# Patient Record
Sex: Male | Born: 1965 | Race: White | Hispanic: No | Marital: Married | Smoking: Never smoker
Health system: Southern US, Community
[De-identification: ages and names within clinical notes are randomized; demographics above are authoritative.]

---

## 2017-04-24 ENCOUNTER — Ambulatory Visit (INDEPENDENT_AMBULATORY_CARE_PROVIDER_SITE_OTHER): Payer: BLUE CROSS/BLUE SHIELD

## 2017-04-24 ENCOUNTER — Ambulatory Visit
Admission: EM | Admit: 2017-04-24 | Discharge: 2017-04-24 | Disposition: A | Discharge status: Home / Self Care | Payer: BLUE CROSS/BLUE SHIELD | Attending: Family Medicine | Admitting: Family Medicine

## 2017-04-24 DIAGNOSIS — L03116 Cellulitis of left lower limb: Secondary | ICD-10-CM | POA: Diagnosis not present

## 2017-04-24 DIAGNOSIS — L02612 Cutaneous abscess of left foot: Secondary | ICD-10-CM | POA: Diagnosis not present

## 2017-04-24 MED ORDER — CEFAZOLIN SODIUM 1 G IJ SOLR
1.0000 g | Freq: Once | INTRAMUSCULAR | Status: AC
  Administered 2017-04-24: 1 g via INTRAMUSCULAR

## 2017-04-24 MED ORDER — SULFAMETHOXAZOLE-TRIMETHOPRIM 800-160 MG PO TABS
1.0000 | ORAL_TABLET | Freq: Two times a day (BID) | ORAL | 0 refills | Status: AC
Start: 2017-04-24 — End: ?

## 2017-04-24 NOTE — ED Triage Notes (Signed)
Pt said he woke up this morning and noticed his left foot was sore and now it's getting progressively worse. Now swollen and looks like a sore on the bottom of his left foot. Also noticed a strip going across the bottom and top of his foot. Has not tired anything otc, doesn't remember injuring it.

## 2017-04-24 NOTE — ED Provider Notes (Addendum)
[facility]    CSN: [id number] Arrival date & time: 04/24/17  1418     History   Chief Complaint Chief Complaint  Patient presents with  . Foot Pain    HPI Pedro Williams is a 51 y.o. male.   51 yo male with a c/o left foot pain, swelling and redness since yesterday but worse this morning. Denies any trauma, injuries, fevers, chills.    The history is provided by the patient.  Foot Pain     History reviewed. No pertinent past medical history.  There are no active problems to display for this patient.   History reviewed. No pertinent surgical history.     Home Medications    Prior to Admission medications   Medication Sig Start Date End Date Taking? Authorizing Provider  sulfamethoxazole-trimethoprim (BACTRIM DS,SEPTRA DS) 800-160 MG tablet Take 1 tablet by mouth 2 (two) times daily. 04/24/17   Payton Mccallum, MD    Family History No family history on file.  Social History Social History  Substance Use Topics  . Smoking status: Never Smoker  . Smokeless tobacco: Never Used  . Alcohol use No     Allergies   Patient has no known allergies.   Review of Systems Review of Systems   Physical Exam Triage Vital Signs ED Triage Vitals  Enc Vitals Group     BP 04/24/17 1434 135/90     Pulse Rate 04/24/17 1434 94     Resp 04/24/17 1434 (!) 22     Temp 04/24/17 1434 98.3 F (36.8 C)     Temp src --      SpO2 04/24/17 1434 98 %     Weight --      Height --      Head Circumference --      Peak Flow --      Pain Score 04/24/17 1433 5     Pain Loc --      Pain Edu? --      Excl. in GC? --    No data found.   Updated Vital Signs BP 135/90 (BP Location: Left Arm)   Pulse 94   Temp 98.3 F (36.8 C)   Resp (!) 22   SpO2 98%   Visual Acuity Right Eye Distance:   Left Eye Distance:   Bilateral Distance:    Right Eye Near:   Left Eye Near:    Bilateral Near:     Physical Exam  Constitutional: He appears well-developed and  well-nourished. No distress.  Musculoskeletal: He exhibits edema.       Left foot: There is tenderness (to palpation over plantar aspect of foot; mild edema, warmth and blanchable erythema noted; ) and swelling.       Feet:  One small 8mm superficial pinpoint pus pocket note on skin on plantar distal  aspect of foot near the 5th toe  Skin: He is not diaphoretic.  Nursing note and vitals reviewed.    UC Treatments / Results  Labs (all labs ordered are listed, but only abnormal results are displayed) Labs Reviewed  AEROBIC CULTURE (SUPERFICIAL SPECIMEN)    EKG  EKG Interpretation None       Radiology Dg Foot Complete Left  Result Date: 04/24/2017 CLINICAL DATA:  Left lateral plantar foot pain. EXAM: LEFT FOOT - COMPLETE 3+ VIEW COMPARISON:  None. FINDINGS: There is no evidence of fracture or dislocation. There is degenerative change the MTP joint of the great toe. No focal bone abnormality. Probable  edema over the fifth metatarsal region. IMPRESSION: Apparent lateral soft tissue swelling. No underlying bony abnormality. Electronically Signed   By: Kennith Center M.D.   On: 04/24/2017 15:35    Procedures .Marland KitchenIncision and Drainage Date/Time: 04/24/2017 5:20 PM Performed by: Payton Mccallum Authorized by: Payton Mccallum   Consent:    Consent obtained:  Verbal   Consent given by:  Patient   Risks discussed:  Bleeding, incomplete drainage, pain, infection and damage to other organs   Alternatives discussed:  No treatment Location:    Type:  Abscess   Location:  Lower extremity   Lower extremity location:  Foot   Foot location:  L foot Pre-procedure details:    Skin preparation:  Antiseptic wash Anesthesia (see MAR for exact dosages):    Anesthesia method:  None Procedure type:    Complexity:  Simple Procedure details:    Needle aspiration: no     Incision types:  Stab incision   Scalpel blade:  11   Drainage:  Purulent   Drainage amount:  Scant   Wound treatment:   Wound left open   Packing materials:  None Post-procedure details:    Patient tolerance of procedure:  Tolerated well, no immediate complications    (including critical care time)  Medications Ordered in UC Medications  ceFAZolin (ANCEF) injection 1 g (1 g Intramuscular Given 04/24/17 1517)     Initial Impression / Assessment and Plan / UC Course  I have reviewed the triage vital signs and the nursing notes.  Pertinent labs & imaging results that were available during my care of the patient were reviewed by me and considered in my medical decision making (see chart for details).       Final Clinical Impressions(s) / UC Diagnoses   Final diagnoses:  Cellulitis of left foot    New Prescriptions Discharge Medication List as of 04/24/2017  4:00 PM    START taking these medications   Details  sulfamethoxazole-trimethoprim (BACTRIM DS,SEPTRA DS) 800-160 MG tablet Take 1 tablet by mouth 2 (two) times daily., Starting Sun 04/24/2017, Normal       1.x-ray results and diagnosis reviewed with patient; given Ancef 1gm IM x 1 2. rx as per orders above; reviewed possible side effects, interactions, risks and benefits  3. Recommend supportive treatment with warm compresses to area, elevation 4. Follow-up prn if symptoms worsen or don't improve   Controlled Substance Prescriptions [place] Controlled Substance Registry consulted? Not Applicable   Payton Mccallum, MD 04/24/17 1722    Payton Mccallum, MD 04/24/17 [phone]

## 2017-04-27 ENCOUNTER — Encounter: Payer: Self-pay | Admitting: Emergency Medicine

## 2017-04-27 ENCOUNTER — Ambulatory Visit
Admission: EM | Admit: 2017-04-27 | Discharge: 2017-04-27 | Disposition: A | Discharge status: Home / Self Care | Payer: BLUE CROSS/BLUE SHIELD | Attending: Family Medicine | Admitting: Family Medicine

## 2017-04-27 DIAGNOSIS — Z5189 Encounter for other specified aftercare: Secondary | ICD-10-CM

## 2017-04-27 LAB — AEROBIC CULTURE  (SUPERFICIAL SPECIMEN): SPECIAL REQUESTS: NORMAL

## 2017-04-27 LAB — AEROBIC CULTURE W GRAM STAIN (SUPERFICIAL SPECIMEN)

## 2017-04-27 MED ORDER — MUPIROCIN 2 % EX OINT: 1.0000 "application " | TOPICAL_OINTMENT | Freq: Two times a day (BID) | CUTANEOUS | 0 refills | Status: AC

## 2017-04-27 NOTE — ED Triage Notes (Signed)
Patient here for wound check to left foot.  Patient reports much improvement.  Patient on Bactrim.

## 2017-04-27 NOTE — ED Provider Notes (Signed)
[facility]    CSN: [id number] Arrival date & time: 04/27/17  1451     History   Chief Complaint Chief Complaint  Patient presents with  . Wound Check    HPI Pedro Williams is a 51 y.o. male.   Patient's 51 year old white male who had bowel movements right foot not sure how it happened states since been dry fungal infection. Open 2 days ago he states the doctor got some pus out culture is doing much better he was placed on Septra DS culture showed staph. He reports much improvement.   The history is provided by the patient. No language interpreter was used.  Wound Check  This is a new problem. The current episode started more than 2 days ago. The problem occurs constantly. The problem has been rapidly improving. Pertinent negatives include no chest pain, no abdominal pain, no headaches and no shortness of breath. Nothing aggravates the symptoms. The symptoms are relieved by medications. Treatments tried: antibiotic. The treatment provided significant relief.    History reviewed. No pertinent past medical history.  There are no active problems to display for this patient.   History reviewed. No pertinent surgical history.     Home Medications    Prior to Admission medications   Medication Sig Start Date End Date Taking? Authorizing Provider  mupirocin ointment (BACTROBAN) 2 % Apply 1 application topically 2 (two) times daily. 04/27/17   Hassan Rowan, MD  sulfamethoxazole-trimethoprim (BACTRIM DS,SEPTRA DS) 800-160 MG tablet Take 1 tablet by mouth 2 (two) times daily. 04/24/17   Payton Mccallum, MD    Family History History reviewed. No pertinent family history.  Social History Social History  Substance Use Topics  . Smoking status: Never Smoker  . Smokeless tobacco: Never Used  . Alcohol use No     Allergies   Patient has no known allergies.   Review of Systems Review of Systems  Respiratory: Negative for shortness of breath.   Cardiovascular:  Negative for chest pain.  Gastrointestinal: Negative for abdominal pain.  Neurological: Negative for headaches.  All other systems reviewed and are negative.    Physical Exam Triage Vital Signs ED Triage Vitals  Enc Vitals Group     BP 04/27/17 1503 (!) 141/91     Pulse Rate 04/27/17 1503 84     Resp 04/27/17 1503 16     Temp 04/27/17 1503 97.6 F (36.4 C)     Temp Source 04/27/17 1503 Oral     SpO2 04/27/17 1503 98 %     Weight 04/27/17 1504 210 lb (95.3 kg)     Height 04/27/17 1504 5\' 10"  (1.778 m)     Head Circumference --      Peak Flow --      Pain Score 04/27/17 1505 0     Pain Loc --      Pain Edu? --      Excl. in GC? --    No data found.   Updated Vital Signs BP (!) 141/91 (BP Location: Left Arm)   Pulse 84   Temp 97.6 F (36.4 C) (Oral)   Resp 16   Ht 5\' 10"  (1.778 m)   Wt 210 lb (95.3 kg)   SpO2 98%   BMI 30.13 kg/m   Visual Acuity Right Eye Distance:   Left Eye Distance:   Bilateral Distance:    Right Eye Near:   Left Eye Near:    Bilateral Near:     Physical Exam  Constitutional: He  appears well-developed and well-nourished.  HENT:  Head: Normocephalic and atraumatic.  Eyes: Pupils are equal, round, and reactive to light.  Neck: Normal range of motion. Neck supple.  Pulmonary/Chest: Effort normal.  Musculoskeletal: Normal range of motion.  Neurological: He is alert.  Skin: Skin is warm. Lesion noted. There is erythema.  Wound appears be healing well  Psychiatric: He has a normal mood and affect.  Vitals reviewed.    UC Treatments / Results  Labs (all labs ordered are listed, but only abnormal results are displayed) Labs Reviewed - No data to display  EKG  EKG Interpretation None       Radiology No results found.  Procedures Procedures (including critical care time)  Medications Ordered in UC Medications - No data to display   Initial Impression / Assessment and Plan / UC Course  I have reviewed the triage vital  signs and the nursing notes.  Pertinent labs & imaging results that were available during my care of the patient were reviewed by me and considered in my medical decision making (see chart for details).     We'll add Bactroban ointment apply twice a day follow-up PCP if needed 3 weeks allowed to return to work tomorrow  Final Clinical Impressions(s) / UC Diagnoses   Final diagnoses:  Visit for wound check  Wound check, abscess    New Prescriptions Discharge Medication List as of 04/27/2017  3:26 PM    START taking these medications   Details  mupirocin ointment (BACTROBAN) 2 % Apply 1 application topically 2 (two) times daily., Starting Wed 04/27/2017, Normal        Note: This dictation was prepared with Dragon dictation along with smaller phrase technology. Any transcriptional errors that result from this process are unintentional. Controlled Substance Prescriptions [place] Controlled Substance Registry consulted? Not Applicable   Hassan RowanWade, Madden Piazza, MD 04/27/17 1534

## 2019-01-08 IMAGING — CR DG FOOT COMPLETE 3+V*L*
3 series · 3 of 3 positions shown · non-contrast
Comparison: None.

CLINICAL DATA: Left lateral plantar foot pain.

EXAM:
LEFT FOOT - COMPLETE 3+ VIEW

[foot ap]
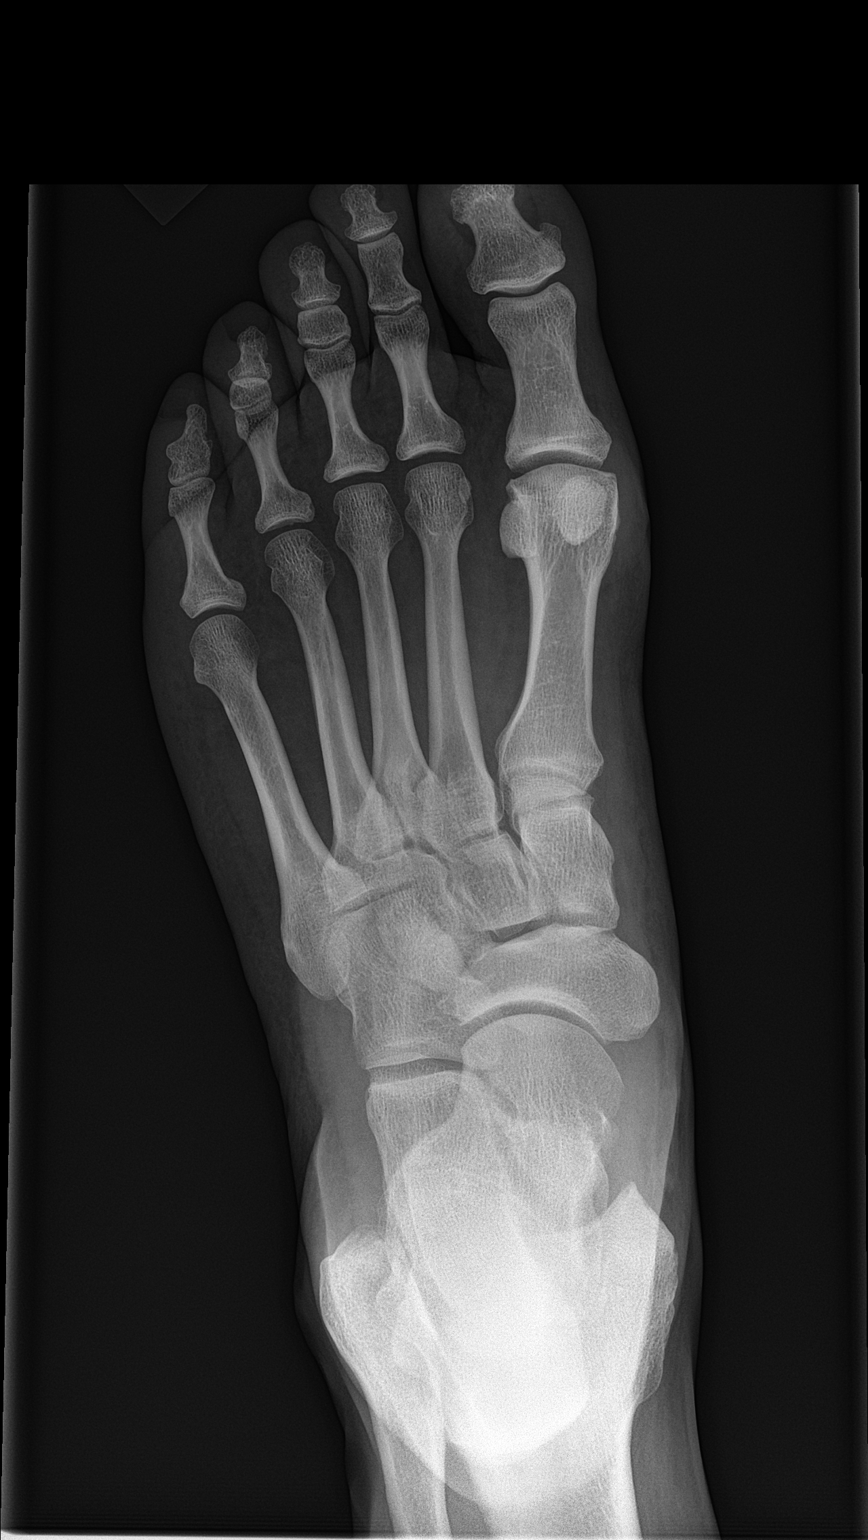

[foot obl]
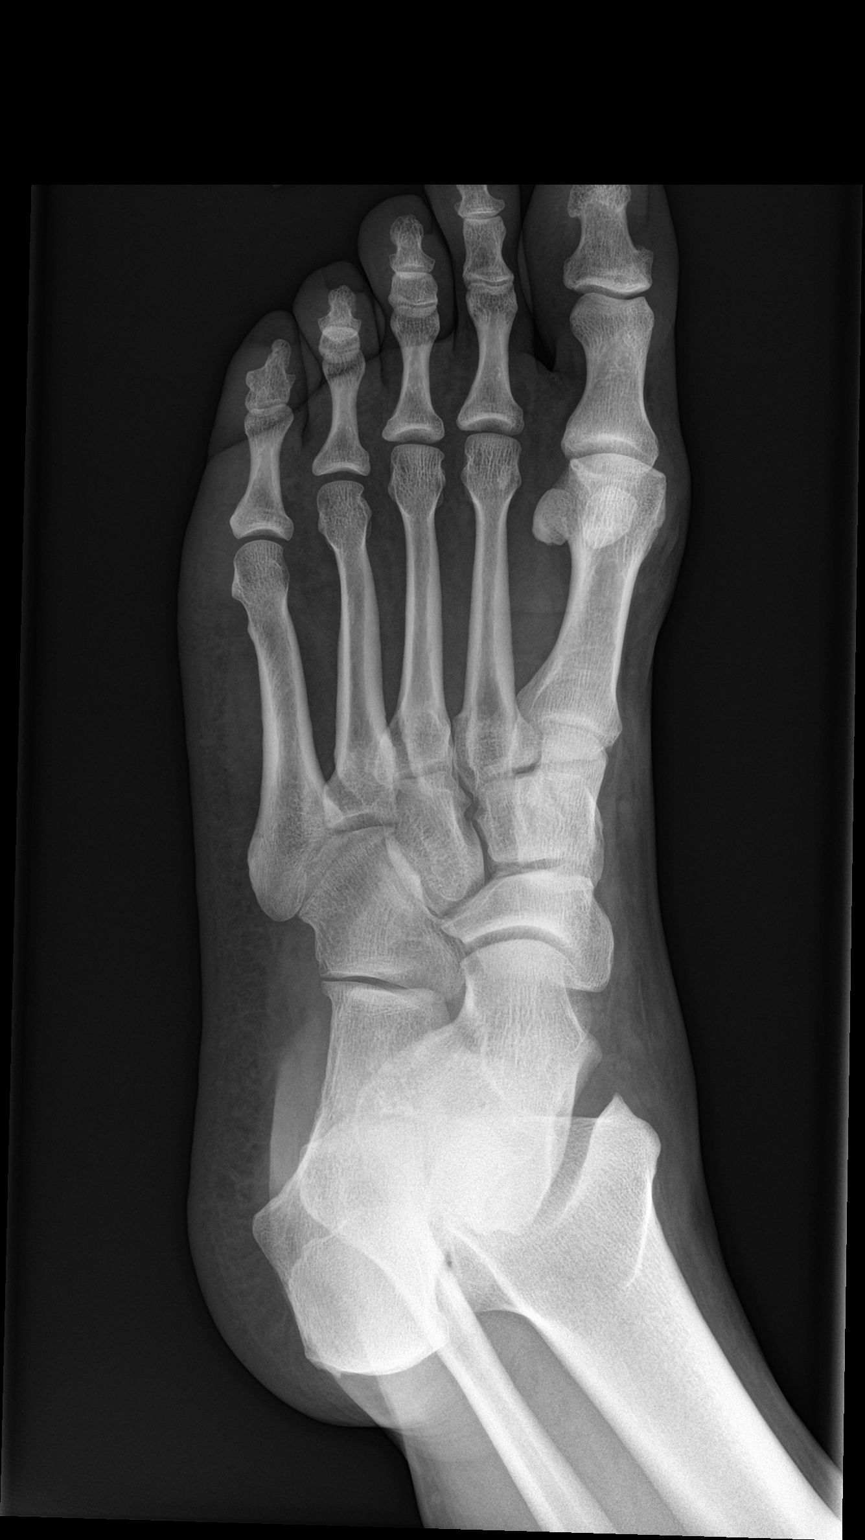

[foot lat]
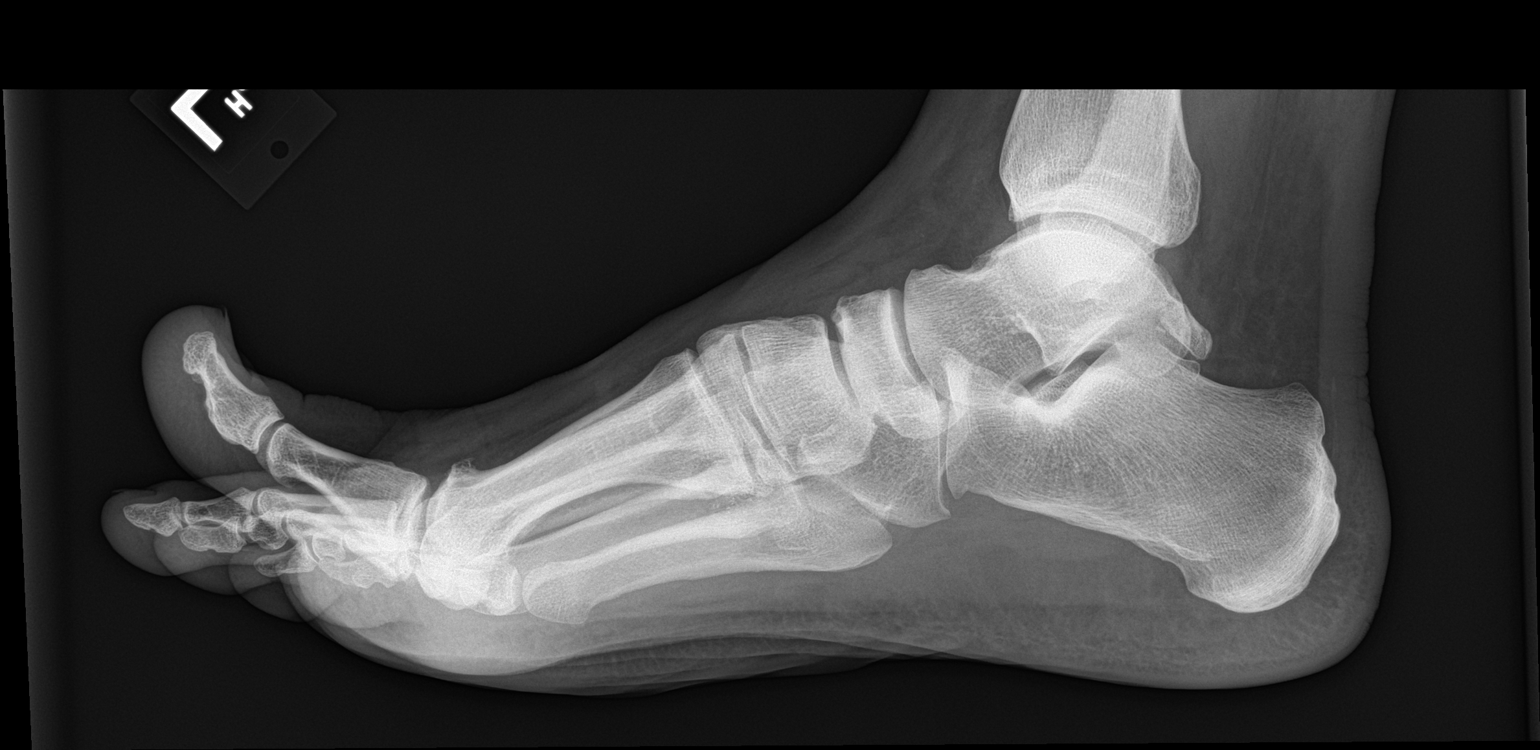

[3 of 3 positions shown; findings below may reference images not displayed]

FINDINGS: There is no evidence of fracture or dislocation. There is
degenerative change the MTP joint of the great toe. No focal bone
abnormality. Probable edema over the fifth metatarsal region.
IMPRESSION: Apparent lateral soft tissue swelling. No underlying bony
abnormality.
